# Patient Record
Sex: Female | Born: 1980 | Race: White | Hispanic: No
Health system: Southern US, Community
[De-identification: ages and names within clinical notes are randomized; demographics above are authoritative.]

---

## 2008-11-04 ENCOUNTER — Ambulatory Visit (HOSPITAL_COMMUNITY): Admission: RE | Admit: 2008-11-04 | Discharge: 2008-11-04 | Payer: Self-pay | Admitting: Obstetrics and Gynecology

## 2008-11-18 ENCOUNTER — Ambulatory Visit (HOSPITAL_COMMUNITY): Admission: RE | Admit: 2008-11-18 | Discharge: 2008-11-18 | Payer: Self-pay | Admitting: Obstetrics and Gynecology

## 2008-12-09 ENCOUNTER — Ambulatory Visit (HOSPITAL_COMMUNITY): Admission: RE | Admit: 2008-12-09 | Discharge: 2008-12-09 | Payer: Self-pay | Admitting: Obstetrics and Gynecology

## 2008-12-30 ENCOUNTER — Ambulatory Visit (HOSPITAL_COMMUNITY): Admission: RE | Admit: 2008-12-30 | Discharge: 2008-12-30 | Payer: Self-pay | Admitting: Obstetrics and Gynecology

## 2010-08-30 ENCOUNTER — Encounter: Payer: Self-pay | Admitting: Obstetrics and Gynecology

## 2013-10-29 ENCOUNTER — Other Ambulatory Visit: Payer: Self-pay

## 2013-11-14 ENCOUNTER — Other Ambulatory Visit (HOSPITAL_COMMUNITY): Payer: Self-pay | Admitting: Obstetrics and Gynecology

## 2013-11-14 DIAGNOSIS — O280 Abnormal hematological finding on antenatal screening of mother: Secondary | ICD-10-CM

## 2013-11-21 ENCOUNTER — Encounter (HOSPITAL_COMMUNITY): Payer: Self-pay

## 2013-11-21 ENCOUNTER — Other Ambulatory Visit (HOSPITAL_COMMUNITY): Payer: Self-pay | Admitting: Obstetrics and Gynecology

## 2013-11-21 ENCOUNTER — Ambulatory Visit (HOSPITAL_COMMUNITY): Admission: RE | Admit: 2013-11-21 | Payer: Medicaid Other | Source: Ambulatory Visit

## 2013-11-21 ENCOUNTER — Ambulatory Visit (HOSPITAL_COMMUNITY)
Admission: RE | Admit: 2013-11-21 | Discharge: 2013-11-21 | Disposition: A | Discharge status: Home / Self Care | Payer: Medicaid Other | Source: Ambulatory Visit | Attending: Obstetrics and Gynecology | Admitting: Obstetrics and Gynecology

## 2013-11-21 DIAGNOSIS — Z363 Encounter for antenatal screening for malformations: Secondary | ICD-10-CM | POA: Insufficient documentation

## 2013-11-21 DIAGNOSIS — O289 Unspecified abnormal findings on antenatal screening of mother: Secondary | ICD-10-CM | POA: Insufficient documentation

## 2013-11-21 DIAGNOSIS — Z8751 Personal history of pre-term labor: Secondary | ICD-10-CM | POA: Insufficient documentation

## 2013-11-21 DIAGNOSIS — Z1389 Encounter for screening for other disorder: Secondary | ICD-10-CM | POA: Insufficient documentation

## 2013-11-21 DIAGNOSIS — O350XX0 Maternal care for (suspected) central nervous system malformation in fetus, not applicable or unspecified: Secondary | ICD-10-CM | POA: Insufficient documentation

## 2013-11-21 DIAGNOSIS — O3500X0 Maternal care for (suspected) central nervous system malformation or damage in fetus, unspecified, not applicable or unspecified: Secondary | ICD-10-CM | POA: Insufficient documentation

## 2013-11-21 DIAGNOSIS — O358XX0 Maternal care for other (suspected) fetal abnormality and damage, not applicable or unspecified: Secondary | ICD-10-CM | POA: Insufficient documentation

## 2013-11-21 DIAGNOSIS — O99330 Smoking (tobacco) complicating pregnancy, unspecified trimester: Secondary | ICD-10-CM | POA: Insufficient documentation

## 2013-11-21 DIAGNOSIS — O280 Abnormal hematological finding on antenatal screening of mother: Secondary | ICD-10-CM

## 2013-11-21 IMAGING — US US OB DETAIL+14 WK
1 series · 16 of 28 positions shown · non-contrast
Comparison: none

[Series 1: us ob detail+14 wk · 0.21mm/px · 16 of 95 slices shown]
[im 1/95]
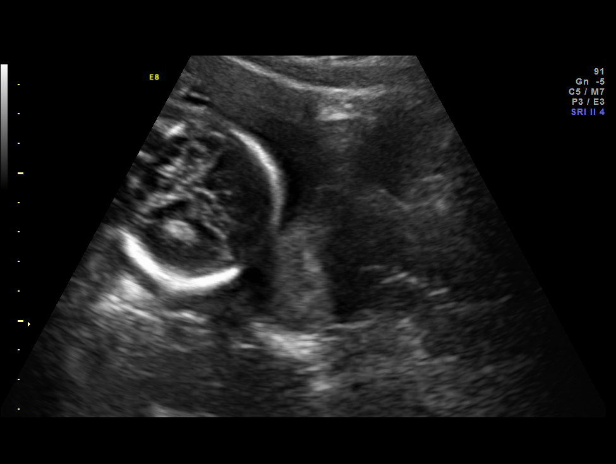
[im 7/95]
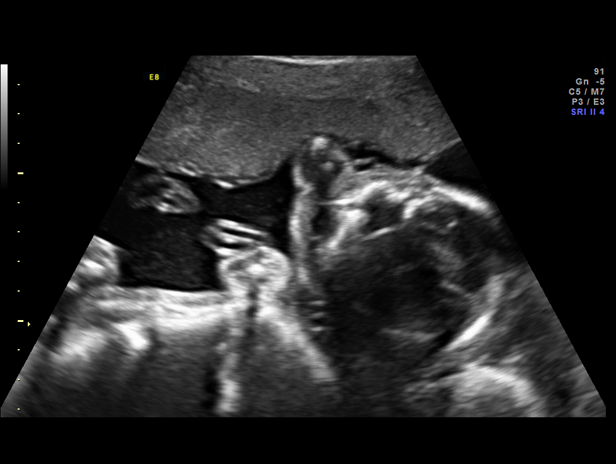
[im 14/95]
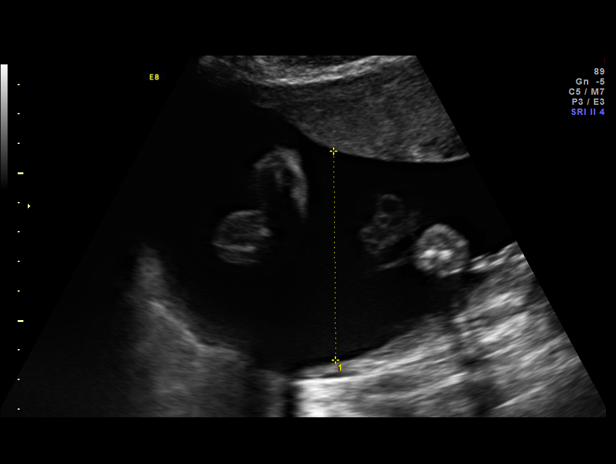
[im 21/95]
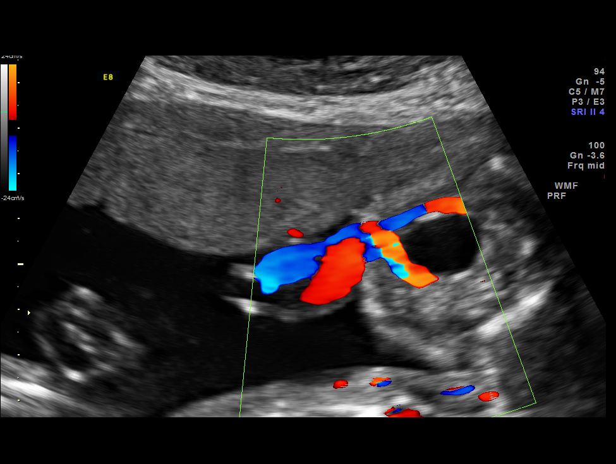
[im 25/95]
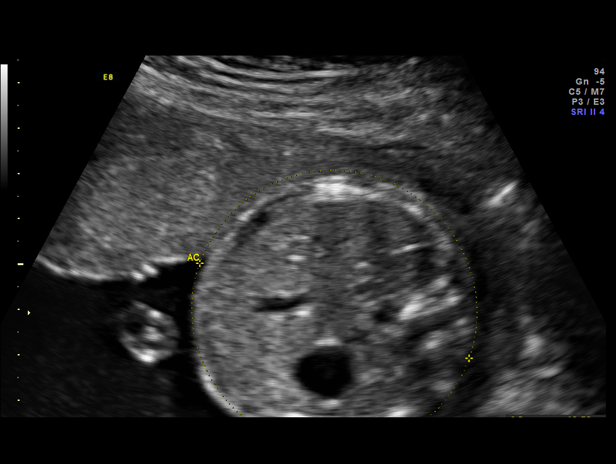
[im 32/95]
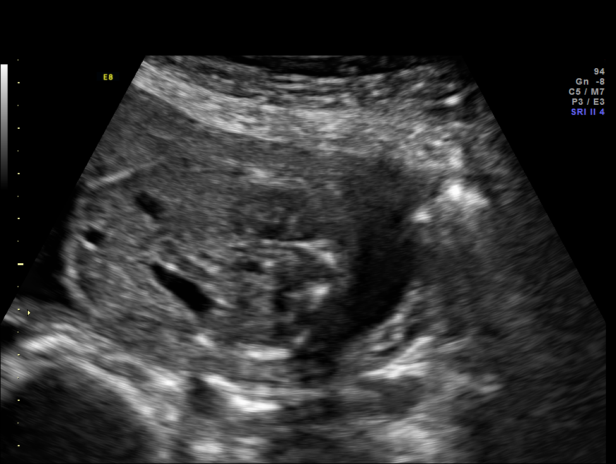
[im 39/95]
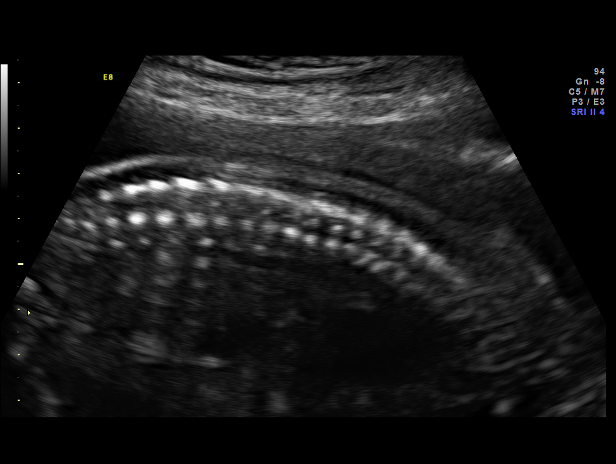
[im 46/95]
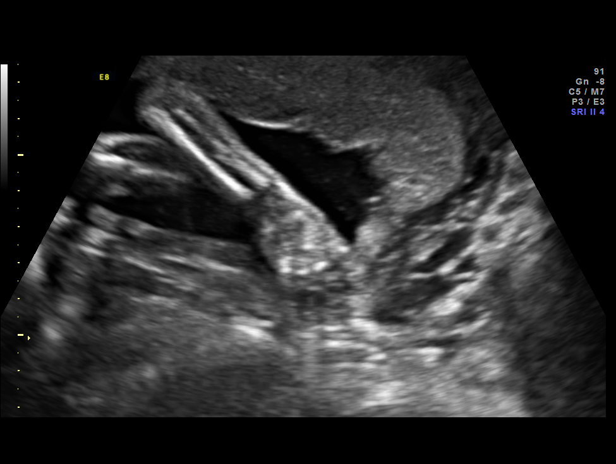
[im 49/95]
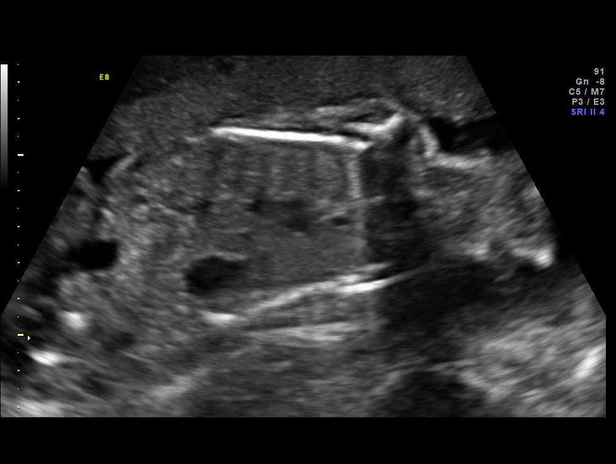
[im 56/95]
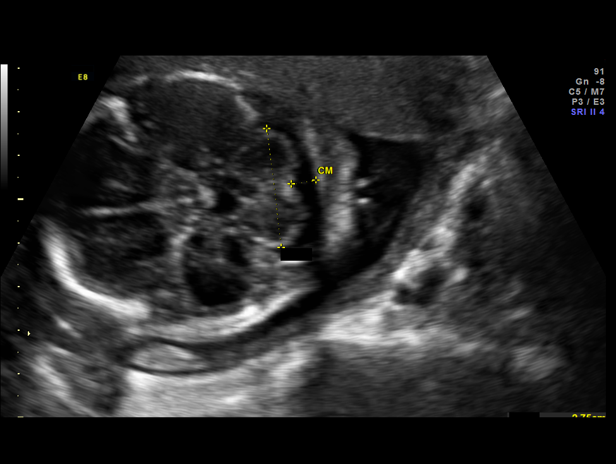
[im 63/95]
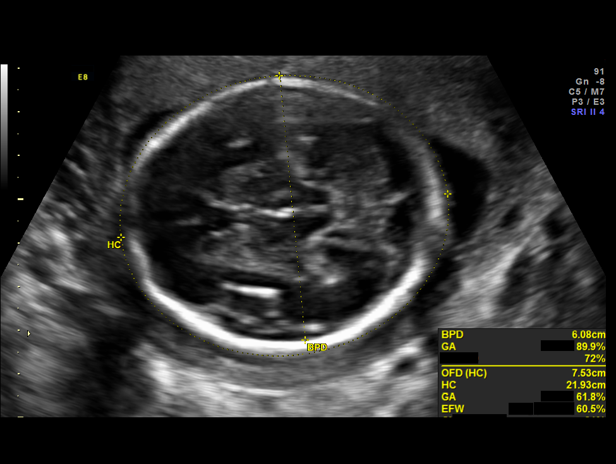
[im 70/95]
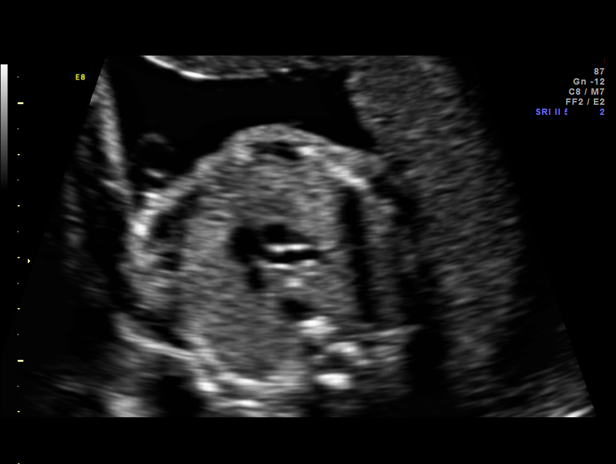
[im 74/95]
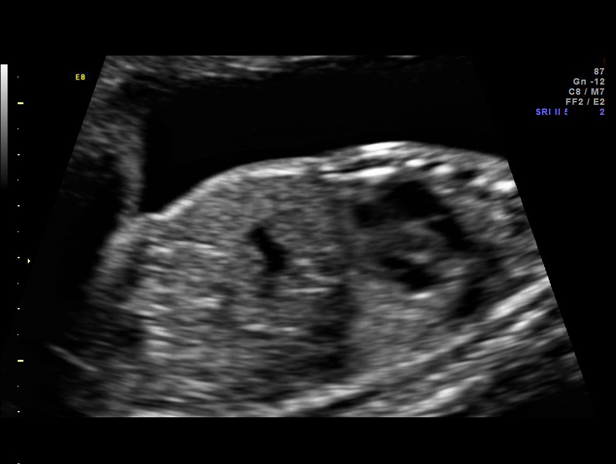
[im 81/95]
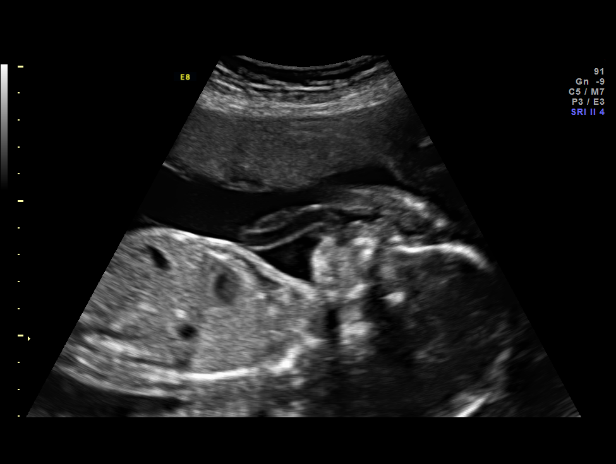
[im 88/95]
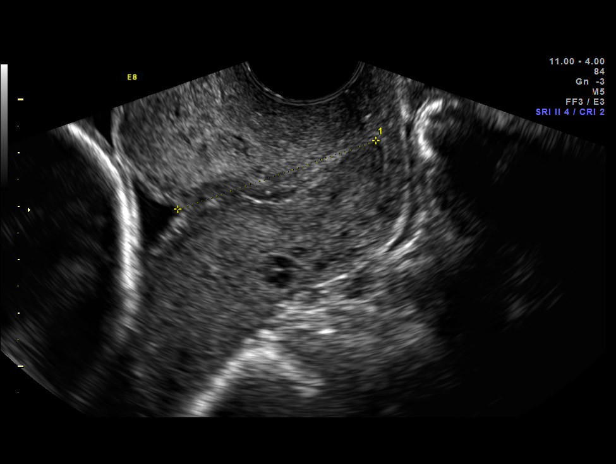
[im 95/95]
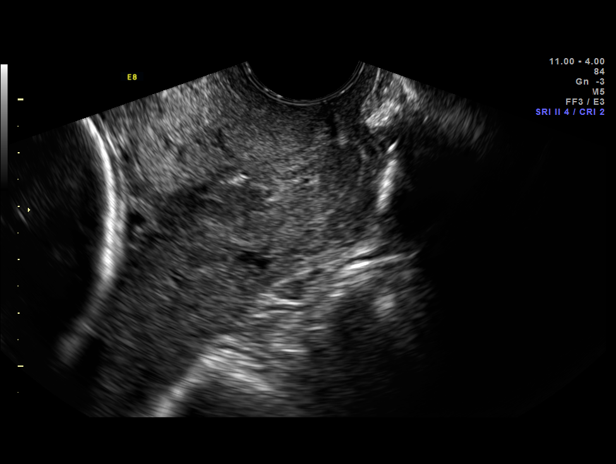

[16 of 28 positions shown; findings below may reference images not displayed]

OBSTETRICS REPORT
                      (Signed Final [DATE] [DATE])

Service(s) Provided

 US OB DETAIL + 14 WK                                  76811.0
 US MFM OB TRANSVAGINAL                                76817.2
Indications

 Detailed fetal anatomic survey                        655.83 [PM]
 Cigarette smoker
 Abnormal biochemical screen
 Elevated MSAFP 2.72 MOM
 Poor obstetric history: Previous preterm delivery     [PM]
Fetal Evaluation

 Num Of Fetuses:    1
 Fetal Heart Rate:  139                          bpm
 Cardiac Activity:  Observed
 Presentation:      Cephalic
 Placenta:          Anterior, above cervical os
 P. Cord            Visualized
 Insertion:

 Amniotic Fluid
 AFI FV:      Subjectively within normal limits
                                             Larg Pckt:     7.1  cm
Biometry

 BPD:     60.6  mm     G. Age:  24w 5d                CI:         80.7   70 - 86
 OFD:     75.1  mm                                    FL/HC:      20.0   19.2 -

 HC:     218.1  mm     G. Age:  23w 6d       57  %    HC/AC:      1.13   1.05 -

 AC:     193.1  mm     G. Age:  24w 0d       65  %    FL/BPD:     71.9   71 - 87
 FL:      43.6  mm     G. Age:  24w 2d       71  %    FL/AC:      22.6   20 - 24
 HUM:     41.2  mm     G. Age:  25w 0d       79  %
 CER:     27.5  mm     G. Age:  24w 5d       80  %

 Est. FW:     665  gm      1 lb 7 oz     67  %
Gestational Age

 LMP:           23w 2d        Date:  [DATE]                 EDD:   [DATE]
 U/S Today:     24w 2d                                        EDD:   [DATE]
 Best:          23w 2d     Det. By:  LMP  ([DATE])          EDD:   [DATE]
Anatomy

 Cranium:          Appears normal         Aortic Arch:      Appears normal
 Fetal Cavum:      Appears normal         Ductal Arch:      Appears normal
 Ventricles:       Appears normal         Diaphragm:        Appears normal
 Choroid Plexus:   Appears normal         Stomach:          Appears normal, left
                                                            sided
 Cerebellum:       Appears normal         Abdomen:          Appears normal
 Posterior Fossa:  Appears normal         Abdominal Wall:   Appears nml (cord
                                                            insert, abd wall)
 Nuchal Fold:      Not applicable (>20    Cord Vessels:     Appears normal (3
                   wks GA)                                  vessel cord)
 Face:             Appears normal         Kidneys:          Appear normal
                   (orbits and profile)
 Lips:             Appears normal         Bladder:          Appears normal
 Heart:            Appears normal         Spine:            Appears normal
                   (4CH, axis, and
                   situs)
 RVOT:             Appears normal         Lower             Appears normal
                                          Extremities:
 LVOT:             Appears normal         Upper             Appears normal
                                          Extremities:

 Other:  Fetus appears to be a female. Heels and 5th digit appear normal.
Targeted Anatomy

 Fetal Central Nervous System
 Cisterna Magna:
Cervix Uterus Adnexa

 Cervical Length:    3.5      cm

 Cervix:       Normal appearance by transvaginal scan Appears
               closed, without funnelling.

 Left Ovary:    Not visualized. No adnexal mass visualized.
 Right Ovary:   Within normal limits.
Comments

 Ms. HIMANI was referred for a detailed fetal anatomic survey
 due to an elevated MSAFP (2.72 MoM).  The patient's fetal
 anatomic survey is now complete.  No fetal anomalies or soft
 markers of aneuploidy were seen.  Results of scan discussed
 with patient, as well as the limitations of U/S to detect all fetal
 anomalies and aneuploidies.  Given the known associated of
 growth restriction and an elevated MSAFP in a structurally
 normal fetus, I recommend Ms. HIMANI be followed with
 serial growth ultrasounds every 4-6 weeks until delivery.
 Antenatal fetal testing is not necessary unless additional
 problems arise.

 It was incidentally noted that Ms. HIMANI has a history of a
 spontaneous preterm birth at 35 weeks.  A transvaginal
 ultrasound was performed and demonstrated a cervical
 length of 3.5 cm, which indicates a low risk of preterm birth.
 However, her recurrence risk can be even further decresed
 with weekly IM injections of 17-OH progesterone.  After
 discussing this option with Ms. HIMANI, she indicated that
 she would like to proceed with this therapy.  She will call her
 primary OB's office to arrange these injections.
Impression

 Single living intrauterine pregnancy at 23 weeks 2 day.
 Appropriate fetal growth (67%).
 Normal amniotic fluid volume.
 Normal fetal anatomy.
 No fetal anomalies or soft markers of aneuploidy seen.
Recommendations

 See comments.

 questions or concerns.
                HIMANI

## 2014-06-09 ENCOUNTER — Encounter (HOSPITAL_COMMUNITY): Payer: Self-pay

## 2014-09-26 ENCOUNTER — Encounter (HOSPITAL_COMMUNITY): Payer: Self-pay | Admitting: *Deleted

## 2017-01-13 DIAGNOSIS — E876 Hypokalemia: Secondary | ICD-10-CM

## 2017-01-13 DIAGNOSIS — R112 Nausea with vomiting, unspecified: Secondary | ICD-10-CM | POA: Diagnosis not present

## 2017-01-13 DIAGNOSIS — N12 Tubulo-interstitial nephritis, not specified as acute or chronic: Secondary | ICD-10-CM | POA: Diagnosis not present

## 2017-01-14 DIAGNOSIS — R112 Nausea with vomiting, unspecified: Secondary | ICD-10-CM | POA: Diagnosis not present

## 2017-01-14 DIAGNOSIS — E876 Hypokalemia: Secondary | ICD-10-CM | POA: Diagnosis not present

## 2017-01-14 DIAGNOSIS — N12 Tubulo-interstitial nephritis, not specified as acute or chronic: Secondary | ICD-10-CM | POA: Diagnosis not present

## 2017-01-15 DIAGNOSIS — R112 Nausea with vomiting, unspecified: Secondary | ICD-10-CM | POA: Diagnosis not present

## 2017-01-15 DIAGNOSIS — E876 Hypokalemia: Secondary | ICD-10-CM | POA: Diagnosis not present

## 2017-01-15 DIAGNOSIS — N12 Tubulo-interstitial nephritis, not specified as acute or chronic: Secondary | ICD-10-CM | POA: Diagnosis not present
